# Patient Record
Sex: Female | Born: 1950 | Race: White | Hispanic: No | Marital: Married | Smoking: Never smoker
Health system: Southern US, Community
[De-identification: ages and names within clinical notes are randomized; demographics above are authoritative.]

## PROBLEM LIST (undated history)

## (undated) DIAGNOSIS — I2510 Atherosclerotic heart disease of native coronary artery without angina pectoris: Secondary | ICD-10-CM

## (undated) DIAGNOSIS — I10 Essential (primary) hypertension: Secondary | ICD-10-CM

## (undated) DIAGNOSIS — E785 Hyperlipidemia, unspecified: Secondary | ICD-10-CM

## (undated) HISTORY — DX: Hyperlipidemia, unspecified: E78.5

## (undated) HISTORY — DX: Essential (primary) hypertension: I10

## (undated) HISTORY — PX: TUMOR EXCISION: SHX421

## (undated) HISTORY — PX: TUBAL LIGATION: SHX77

## (undated) HISTORY — DX: Atherosclerotic heart disease of native coronary artery without angina pectoris: I25.10

---

## 2006-02-14 ENCOUNTER — Ambulatory Visit: Payer: Self-pay | Admitting: General Surgery

## 2009-06-19 ENCOUNTER — Ambulatory Visit: Payer: Self-pay | Admitting: Family Medicine

## 2009-07-02 ENCOUNTER — Inpatient Hospital Stay: Payer: Self-pay | Admitting: Internal Medicine

## 2009-07-03 ENCOUNTER — Encounter: Payer: Self-pay | Admitting: Cardiovascular Disease

## 2009-09-01 ENCOUNTER — Ambulatory Visit: Payer: Self-pay | Admitting: Family Medicine

## 2009-09-29 ENCOUNTER — Other Ambulatory Visit: Payer: Self-pay | Admitting: General Practice

## 2010-01-02 ENCOUNTER — Encounter: Payer: Self-pay | Admitting: Cardiovascular Disease

## 2010-01-02 ENCOUNTER — Other Ambulatory Visit: Payer: Self-pay | Admitting: Family Medicine

## 2010-01-02 LAB — CONVERTED CEMR LAB
AST: 14 units/L
Basophils Relative: 0 %
CO2: 31 meq/L
Calcium: 9.2 mg/dL
Chloride: 101 meq/L
Cholesterol: 134 mg/dL
Eosinophils Relative: 6 %
HCT: 39.3 %
Hemoglobin: 12.9 g/dL
Lymphocytes, automated: 24.9 %
Monocytes Relative: 5.5 %
Potassium: 4.3 meq/L
Sodium: 140 meq/L
WBC: 4.8 10*3/uL

## 2010-02-24 ENCOUNTER — Ambulatory Visit: Payer: Self-pay | Admitting: Cardiovascular Disease

## 2010-02-24 DIAGNOSIS — E119 Type 2 diabetes mellitus without complications: Secondary | ICD-10-CM

## 2010-02-24 DIAGNOSIS — I10 Essential (primary) hypertension: Secondary | ICD-10-CM | POA: Insufficient documentation

## 2010-02-24 DIAGNOSIS — I2510 Atherosclerotic heart disease of native coronary artery without angina pectoris: Secondary | ICD-10-CM | POA: Insufficient documentation

## 2010-02-24 DIAGNOSIS — E785 Hyperlipidemia, unspecified: Secondary | ICD-10-CM | POA: Insufficient documentation

## 2010-03-02 ENCOUNTER — Encounter: Payer: Self-pay | Admitting: Cardiovascular Disease

## 2010-04-08 ENCOUNTER — Other Ambulatory Visit: Payer: Self-pay | Admitting: Family Medicine

## 2010-09-15 ENCOUNTER — Encounter: Payer: Self-pay | Admitting: Cardiovascular Disease

## 2010-09-21 ENCOUNTER — Ambulatory Visit: Payer: Self-pay | Admitting: Cardiovascular Disease

## 2010-09-30 ENCOUNTER — Encounter: Payer: Self-pay | Admitting: Cardiovascular Disease

## 2010-12-17 ENCOUNTER — Ambulatory Visit: Payer: Self-pay | Admitting: Family Medicine

## 2011-01-12 NOTE — Miscellaneous (Signed)
  Clinical Lists Changes  Observations: Added new observation of ALBUMIN: 3.7 g/dL (86/57/8469 62:95) Added new observation of PROTEIN, TOT: 7.7 g/dL (28/41/3244 01:02) Added new observation of CALCIUM: 9.2 mg/dL (72/53/6644 03:47) Added new observation of ALK PHOS: 76 units/L (01/02/2010 17:00) Added new observation of BILI TOTAL: 0.5 mg/dL (42/59/5638 75:64) Added new observation of SGPT (ALT): 36 units/L (01/02/2010 17:00) Added new observation of SGOT (AST): 14 units/L (01/02/2010 17:00) Added new observation of CO2 PLSM/SER: 31 meq/L (01/02/2010 17:00) Added new observation of CL SERUM: 101 meq/L (01/02/2010 17:00) Added new observation of K SERUM: 4.3 meq/L (01/02/2010 17:00) Added new observation of NA: 140 meq/L (01/02/2010 17:00) Added new observation of CREATININE: 0.70 mg/dL (33/29/5188 41:66) Added new observation of BUN: 20 mg/dL (06/11/1600 09:32) Added new observation of BG RANDOM: 124 mg/dL (35/57/3220 25:42) Added new observation of TRIGLYCERIDE: 102 mg/dL (70/62/3762 83:15) Added new observation of HDL: 31 mg/dL (17/61/6073 71:06) Added new observation of LDL: 83 mg/dL (26/94/8546 27:03) Added new observation of CHOLESTEROL: 134 mg/dL (50/08/3817 29:93) Added new observation of BASOPHIL %: 0 % (01/02/2010 16:59) Added new observation of % EOS AUTO: 6.0 % (01/02/2010 16:59) Added new observation of MONOCYTE %: 5.5 % (01/02/2010 16:59) Added new observation of LYMPH %: 24.9 % (01/02/2010 16:59) Added new observation of PMN %: 63.2 % (01/02/2010 16:59) Added new observation of RDW: 14.2 % (01/02/2010 16:59) Added new observation of MCV: 79 fL (01/02/2010 16:59) Added new observation of PLATELETK/UL: 211 K/uL (01/02/2010 16:59) Added new observation of RBC M/UL: 5.00 M/uL (01/02/2010 16:59) Added new observation of HCT: 39.3 % (01/02/2010 16:59) Added new observation of HGB: 12.9 g/dL (71/69/6789 38:10) Added new observation of WBC COUNT: 4.8 10*3/microliter (01/02/2010  16:59)      -  Date:  01/02/2010    WBC: 4.8    HGB: 12.9    HCT: 39.3    RBC: 5.00    PLT: 211    MCV: 79    RDW: 14.2    Neutrophil: 63.2    Lymphs: 24.9    Monos: 5.5    Eos: 6.0    Basophil: 0    Cholesterol: 134    LDL: 83    HDL: 31    Triglycerides: 175    BG Random: 124    BUN: 20    Creatinine: 0.70    Sodium: 140    Potassium: 4.3    Chloride: 101    CO2 Total: 31    SGOT (AST): 14    SGPT (ALT): 36    T. Bilirubin: 0.5    Alk Phos: 76    Calcium: 9.2    Total Protein: 7.7    Albumin: 3.7

## 2011-01-12 NOTE — Miscellaneous (Signed)
Summary: Lisinopril  Clinical Lists Changes  Medications: Changed medication from LISINOPRIL 20 MG TABS (LISINOPRIL) Take one tablet by mouth daily to LISINOPRIL 20 MG TABS (LISINOPRIL) Take one tablet by mouth daily - Signed Rx of LISINOPRIL 20 MG TABS (LISINOPRIL) Take one tablet by mouth daily;  #30 x 6;  Signed;  Entered by: Benedict Needy, RN;  Authorized by: Dossie Arbour MD;  Method used: Electronically to [facility] [address], [place], [place], [place]  [address], Ph: [phone], Fax: [phone]    Prescriptions: LISINOPRIL 20 MG TABS (LISINOPRIL) Take one tablet by mouth daily  #30 x 6   Entered by:   Benedict Needy, RN   Authorized by:   Dossie Arbour MD   Signed by:   Benedict Needy, RN on 09/15/2010   Method used:   Electronically to        [facility] (retail)       [address]       [place], [place]  [address]       Ph: [phone]       Fax: [phone]   RxID:   [id number]

## 2011-01-12 NOTE — Cardiovascular Report (Signed)
Summary: Cardiac Cath Other  Cardiac Cath Other   Imported By: Harlon Flor 01/05/2010 10:39:11  _____________________________________________________________________  External Attachment:    Type:   Image     Comment:   External Document

## 2011-01-12 NOTE — Assessment & Plan Note (Signed)
Summary: NP6/AMD   Visit Type:  New Patient Referring Provider:  Kirke Corin Primary Provider:  Nilda Simmer, MD   History of Present Illness: very pleasant 60 year old woman with past medical history of coronary artery disease, 90% right coronary artery stenosis with drug eluding stent placed July 03, 2009 with mild disease in the other arteries, ejection fraction 55%, mild mitral regurgitation by echocardiogram in July 2010, poorly controlled diabetes, hyperlipidemia, obesity with a long history of secondhand smoke and hypertension who presents to establish care.  Jamie Barton states that overall she has been feeling well since her stent was placed in July 2010. Continues to work at lifestyles in an effort to improve her sugars. She states her last hemoglobin A1c was still greater than 8. She is uncertain what her cholesterol level is that she's been on a high-dose simvastatin 80 mg daily since July of last year. She is due to followup with Dr. Katrinka Blazing next month.  She is going to join a gym with her husband in the abdomen and is looking forward to working out. She is trying to lose weight in an effort to control her sugars. She states that her blood pressure has been well-controlled at home typically in the 120s to 130s systolic.  Catheterization report from July 03, 2009 shows 25% proximal LAD, 30% proximal circumflex, 25% ostial ramus disease, severe disease in the proximal RCA. PCI of ostial RCA was performed. Plavix recommended for at least one year.  Preventive Screening-Counseling & Management  Alcohol-Tobacco     Smoking Status: never  Caffeine-Diet-Exercise     Does Patient Exercise: yes      Drug Use:  no.    Current Problems (verified): 1)  Cad  (ICD-414.00) 2)  Dyslipidemia  (ICD-272.4) 3)  Hypertension, Benign  (ICD-401.1)  Current Medications (verified): 1)  Metformin Hcl 1000 Mg Tabs (Metformin Hcl) .Marland Kitchen.. 1 By Mouth Two Times A Day 2)  Lisinopril 20 Mg Tabs (Lisinopril) ....  Take One Tablet By Mouth Daily 3)  Plavix 75 Mg Tabs (Clopidogrel Bisulfate) .... Take One Tablet By Mouth Daily 4)  Glipizide 10 Mg Tabs (Glipizide) .Marland Kitchen.. 1 By Mouth Once Daily 5)  Simvastatin 80 Mg Tabs (Simvastatin) .... Take One Tablet By Mouth Daily At Bedtime 6)  Aspirin 81 Mg Tbec (Aspirin) .... Take One Tablet By Mouth Daily 7)  Januvia 50 Mg Tabs (Sitagliptin Phosphate) .Marland Kitchen.. 1 By Mouth Once Daily 8)  Nitrostat 0.4 Mg Subl (Nitroglycerin) .Marland Kitchen.. 1 Tablet Under Tongue At Onset of Chest Pain; You May Repeat Every 5 Minutes For Up To 3 Doses.  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  Alcohol Use - no Regular Exercise - yes Drug Use - no Smoking Status:  never Does Patient Exercise:  yes Drug Use:  no  Review of Systems  The patient denies fever, weight loss, weight gain, vision loss, decreased hearing, hoarseness, chest pain, syncope, dyspnea on exertion, peripheral edema, prolonged cough, abdominal pain, incontinence, muscle weakness, difficulty walking, depression, and enlarged lymph nodes.    Vital Signs:  Patient profile:   60 year old female Height:      65 inches Weight:  209.75 pounds BMI:     35.03 Pulse rate:   64 / minute Pulse rhythm:   regular BP sitting:   140 / 76  (left arm) Cuff size:   large  Vitals Entered By: Mercer Pod (February 24, 2010 11:29 AM)  Physical Exam  General:  well-appearing middle-aged woman in no apparent distress, H. EENT exam is benign, neck is supple with no JVP or carotid bruits, heart sounds regular  with S1-S2 no murmurs appreciated, lungs are clear to auscultation with no wheezes or rales, abdominal exam is benign, no significant lower extremity edema, pulses are equal and symmetrical in her upper and lower extremities, neurologic exam is grossly nonfocal, skin is warm and dry.    Impression & Recommendations:  Problem # 1:  CAD (ICD-414.00) stents placed to her ostial RCA in July 2010. No stress test indicated at this time G-tube no significant symptoms and due to recent intervention.   Her updated medication list for this problem includes:    Lisinopril 20 Mg Tabs (Lisinopril) .Marland Kitchen... Take one tablet by mouth daily    Plavix 75 Mg Tabs (Clopidogrel bisulfate) .Marland Kitchen... Take one tablet by mouth daily    Aspirin 81 Mg Tbec (Aspirin) .Marland Kitchen... Take one tablet by mouth daily    Nitrostat 0.4 Mg Subl (Nitroglycerin) .Marland Kitchen... 1 tablet under tongue at onset of chest pain; you may repeat every 5 minutes for up to 3 doses.  Problem # 2:  DYSLIPIDEMIA (ICD-272.4) We'll try to obtain the most recent cholesterol panel from Dr. Katrinka Blazing for our records. Ideally her goal LDL should be less than 70  Her updated medication list for this problem includes:    Simvastatin 80 Mg Tabs (Simvastatin) .Marland Kitchen... Take one tablet by mouth daily at bedtime  Problem # 3:  HYPERTENSION, BENIGN (ICD-401.1) Blood pressure is borderline elevated today though she states that typically at home her systolic range in the 120 area which would be excellent for her. We have not made any medication changes.  Her updated medication list for this problem includes:    Lisinopril 20 Mg Tabs (Lisinopril) .Marland Kitchen... Take one tablet by mouth daily    Aspirin 81 Mg Tbec (Aspirin) .Marland Kitchen... Take one tablet by mouth daily  Problem # 4:  DIAB W/O COMP TYPE II/UNS NOT STATED UNCNTRL (ICD-250.00) She is followed at [facility] for her diabetes. She states that she continues to cheat on occasion and over the holidays her sugars were in the 200s. Her hemoglobin  A1c is greater than 8. We have stressed to her the importance of good diabetes control in an effort to slow down the progression of her coronary artery disease and peripheral vascular disease. She'll continue to work hard on this. She may need additional medications and I will leave this up to lifestyles and Dr. Katrinka Blazing to manage.  Her updated medication list for this problem includes:    Metformin Hcl 1000 Mg Tabs (Metformin hcl) .Marland Kitchen... 1 by mouth two times a day    Lisinopril 20 Mg Tabs (Lisinopril) .Marland Kitchen... Take one tablet by mouth daily    Glipizide 10 Mg Tabs (Glipizide) .Marland Kitchen... 1 by mouth once daily    Aspirin 81 Mg Tbec (Aspirin) .Marland Kitchen... Take one tablet by mouth daily    Januvia 50 Mg Tabs (Sitagliptin phosphate) .Marland Kitchen... 1 by mouth once daily

## 2011-01-12 NOTE — Assessment & Plan Note (Signed)
Summary: F6M/AMD   Visit Type:  Follow-up Referring Provider:  Kirke Corin Primary Provider:  Nilda Simmer, MD  CC:  Denies chest pain or shortness of breath..  History of Present Illness: very pleasant 61 year old woman with past medical history of coronary artery disease, 90% right coronary artery stenosis with drug eluding stent placed July 03, 2009 with mild disease in the other arteries, ejection fraction 55%, mild mitral regurgitation by echocardiogram in July 2010, diabetes, hyperlipidemia, obesity with a long history of second hand smoke and hypertension who presents 4 routine followup.  Jamie Barton states that overall she is doing well. Her sugars have been better controlled though she did go to the beach recently and had weight gain and elevated glucose levels in the 200s. She has been nursing she got home. She is active, goes to the gym and has no complaints of chest pain or shortness of breath. Her blood pressure at home has been well controlled with systolics in the 120s to 130s. She does report a previous hemoglobin A1c in the 6-7 range.  Catheterization report from July 03, 2009 shows 25% proximal LAD, 30% proximal circumflex, 25% ostial ramus disease, severe disease in the proximal RCA. PCI of ostial RCA was performed. Plavix recommended for at least one year.  we do not have her most recent lipid panel and she had this checked recently at [facility].  EKG shows normal sinus rhythm with rate of 67 beats per minute, rare PVC, no significant ST or T wave changes  Current Medications (verified): 1)  Metformin Hcl 1000 Mg Tabs (Metformin Hcl) .Marland Kitchen.. 1 By Mouth Two Times A Day 2)  Lisinopril 20 Mg Tabs (Lisinopril) .... Take One Tablet By Mouth Daily 3)  Plavix 75 Mg Tabs (Clopidogrel Bisulfate) .... Take One Tablet By Mouth Daily 4)  Glipizide 10 Mg Tabs (Glipizide) .Marland Kitchen.. 1 By Mouth Once Daily 5)  Simvastatin 40 Mg Tabs (Simvastatin) .... One Tablet At Bedtime 6)  Aspirin 81 Mg Tbec (Aspirin)  .... Take One Tablet By Mouth Daily 7)  Januvia 50 Mg Tabs (Sitagliptin Phosphate) .Marland Kitchen.. 1 By Mouth Once Daily 8)  Nitrostat 0.4 Mg Subl (Nitroglycerin) .Marland Kitchen.. 1 Tablet Under Tongue At Onset of Chest Pain; You May Repeat Every 5 Minutes For Up To 3 Doses.  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  Alcohol Use - no Regular Exercise - yes Drug Use - no  Risk Factors: Exercise: yes (02/25/10)  Risk Factors: Smoking Status: never (02-25-2010)  Review of Systems  The patient denies fever, weight loss, weight gain, vision loss, decreased hearing, hoarseness, chest pain, syncope, dyspnea on exertion, peripheral edema, prolonged cough, abdominal pain, incontinence, muscle weakness, depression, and enlarged lymph nodes.    Vital Signs:  Patient profile:   60 year old female Height:      65 inches Weight:      212 pounds BMI:     35.41 Pulse rate:   61 / minute BP sitting:   154 / 85  (left arm) Cuff size:   large  Vitals Entered By: Bishop Dublin, CMA (  September 21, 2010 11:07 AM)  Physical Exam  General:  well-appearing middle-aged woman in no apparent distress, H. EENT exam is benign, neck is supple with no JVP or carotid bruits, heart sounds regular with S1-S2 no murmurs appreciated, lungs are clear to auscultation with no wheezes or rales, abdominal exam is benign, no significant lower extremity edema, pulses are equal and symmetrical in her upper and lower  extremities, neurologic exam is grossly nonfocal, skin is warm and dry.   Impression & Recommendations:  Problem # 1:  CAD (ICD-414.00) no symptoms of chest pain or shortness of breath concerning for angina. No testing indicated at this time.  Her updated medication list for this problem includes:    Lisinopril 20 Mg Tabs (Lisinopril) .Marland Kitchen... Take one tablet by mouth daily    Plavix 75 Mg Tabs (Clopidogrel bisulfate) .Marland Kitchen... Take one tablet by mouth daily    Aspirin 81 Mg Tbec (Aspirin) .Marland Kitchen... Take one tablet by mouth daily    Nitrostat 0.4 Mg Subl (Nitroglycerin) .Marland Kitchen... 1 tablet under tongue at onset of chest pain; you may repeat every 5 minutes for up to 3 doses.  Problem # 2:  DYSLIPIDEMIA (ICD-272.4) We will try to obtain her most recent lipid panel from [facility] this week. Goal LDL is less than 70.  Her updated medication list for this problem includes:    Simvastatin 40 Mg Tabs (Simvastatin) ..... One tablet at bedtime  Problem # 3:  HYPERTENSION, BENIGN (ICD-401.1) Blood pressure was elevated today in the clinic though she reported having a headache. She reports at home is well controlled.  Her updated medication list for this problem includes:    Lisinopril 20 Mg Tabs (Lisinopril) .Marland Kitchen... Take one tablet by mouth daily    Aspirin 81 Mg Tbec (Aspirin) .Marland Kitchen... Take one tablet by mouth daily  Problem # 4:  DIAB W/O COMP TYPE II/UNS NOT STATED UNCNTRL (ICD-250.00) She had good sugar control until recently when she went to the beach. We have asked her to watch her diet and weight in an effort to keep her diabetes well controlled.  Her updated medication list for this problem includes:    Metformin Hcl 1000 Mg Tabs (Metformin hcl) .Marland Kitchen... 1 by mouth two times a day    Lisinopril 20 Mg Tabs (Lisinopril) .Marland Kitchen... Take one tablet by mouth daily    Glipizide 10 Mg Tabs (Glipizide) .Marland Kitchen... 1 by mouth once daily    Aspirin 81 Mg Tbec (Aspirin) .Marland Kitchen... Take one tablet by mouth daily    Januvia 50 Mg Tabs  (Sitagliptin phosphate) .Marland Kitchen... 1 by mouth once daily

## 2011-01-12 NOTE — Progress Notes (Signed)
Summary: PHI  PHI   Imported By: Harlon Flor 02/25/2010 10:45:05  _____________________________________________________________________  External Attachment:    Type:   Image     Comment:   External Document

## 2011-01-12 NOTE — Letter (Signed)
Summary: [facility] - [facility]  Architectural technologist at [address]   [place], [place] [address]   Phone: [phone]  Fax: [phone]     September 30, 2010 MRN: [id number]   Jamie Barton [address] [place], [place]  [address]   Dear Ms. Chanda,  We have reviewed your cholesterol results.  They are as follows:     Total Cholesterol:    135 (Desirable: less than 150)       HDL  Cholesterol:     31  (Desirable: greater than 40 for men and 50 for women)       LDL Cholesterol:       83  (Desirable: less than 70 for moderate to high risk)       Triglycerides:         (Desirable: less than 150)  Our recommendations include: No changes at this time, continue to diet and excerise.    Call our office at the number listed above if you have any questions.  Lowering your LDL cholesterol is important, but it is only one of a large number of "risk factors" that may indicate that you are at risk for heart disease, stroke or other complications of hardening of the arteries.  Other risk factors include:   A.  Cigarette Smoking* B.  High Blood Pressure* C.  Obesity* D.   Low HDL Cholesterol (see yours above)* E.   Diabetes Mellitus (higher risk if your is uncontrolled) F.  Family history of premature heart disease G.  Previous history of stroke or cardiovascular disease    *These are risk factors YOU HAVE CONTROL OVER.  For more information, visit .  There is now evidence that lowering the TOTAL CHOLESTEROL AND LDL CHOLESTEROL can reduce the risk of heart disease.  The American Heart Association recommends the following guidelines for the treatment of elevated cholesterol:  1.  If there is now current heart disease and less than two risk factors, TOTAL CHOLESTEROL should be less than 200 and LDL CHOLESTEROL should be less than 100. 2.  If there is current heart disease or two or more risk factors, TOTAL CHOLESTEROL should be less than 200 and LDL CHOLESTEROL should  be less than 70.  A diet low in cholesterol, saturated fat, and calories is the cornerstone of treatment for elevated cholesterol.  Cessation of smoking and exercise are also important in the management of elevated cholesterol and preventing vascular disease.  Studies have shown that 30 to 60 minutes of physical activity most days can help lower blood pressure, lower cholesterol, and keep your weight at a healthy level.  Drug therapy is used when cholesterol levels do not respond to therapeutic lifestyle changes (smoking cessation, diet, and exercise) and remains unacceptably high.  If medication is started, it is important to have you levels checked periodically to evaluate the need for further treatment options.  Thank you,    [employer] Team

## 2011-02-16 ENCOUNTER — Other Ambulatory Visit: Payer: Self-pay | Admitting: Family Medicine

## 2011-02-19 ENCOUNTER — Ambulatory Visit: Payer: Self-pay | Admitting: Family Medicine

## 2011-03-22 ENCOUNTER — Other Ambulatory Visit: Payer: Self-pay | Admitting: Emergency Medicine

## 2011-03-22 MED ORDER — LISINOPRIL 20 MG PO TABS: 20.0000 mg | ORAL_TABLET | Freq: Every day | ORAL | Status: AC

## 2011-03-25 ENCOUNTER — Ambulatory Visit: Payer: Self-pay | Admitting: Urology

## 2011-06-23 ENCOUNTER — Encounter: Payer: Self-pay | Admitting: Cardiovascular Disease

## 2011-07-10 ENCOUNTER — Observation Stay: Payer: Self-pay | Admitting: Internal Medicine

## 2011-07-10 DIAGNOSIS — R079 Chest pain, unspecified: Secondary | ICD-10-CM

## 2011-07-13 ENCOUNTER — Encounter: Payer: Self-pay | Admitting: *Deleted

## 2011-07-13 ENCOUNTER — Ambulatory Visit (INDEPENDENT_AMBULATORY_CARE_PROVIDER_SITE_OTHER): Payer: No Typology Code available for payment source | Admitting: Cardiovascular Disease

## 2011-07-13 ENCOUNTER — Encounter: Payer: Self-pay | Admitting: Cardiovascular Disease

## 2011-07-13 DIAGNOSIS — R079 Chest pain, unspecified: Secondary | ICD-10-CM

## 2011-07-13 DIAGNOSIS — E119 Type 2 diabetes mellitus without complications: Secondary | ICD-10-CM

## 2011-07-13 DIAGNOSIS — E785 Hyperlipidemia, unspecified: Secondary | ICD-10-CM

## 2011-07-13 DIAGNOSIS — I2510 Atherosclerotic heart disease of native coronary artery without angina pectoris: Secondary | ICD-10-CM

## 2011-07-13 DIAGNOSIS — I10 Essential (primary) hypertension: Secondary | ICD-10-CM

## 2011-07-13 NOTE — Assessment & Plan Note (Signed)
Cholesterol is at goal on the current lipid regimen. No changes to the medications were made.  

## 2011-07-13 NOTE — Patient Instructions (Signed)
You are doing well. No medication changes were made. Please call us if you have new issues that need to be addressed before your next appt.  We will call you for a follow up Appt. In 12 months  

## 2011-07-13 NOTE — Progress Notes (Signed)
Patient ID: Jamie Barton, female    DOB: [birth year], 60 y.o.   MRN: [id number]  HPI Comments: pleasant 60 year old woman with past medical history of coronary artery disease, 90% right coronary artery stenosis with drug eluding stent placed July 03, 2009 with mild disease in the other arteries, ejection fraction 55%, mild mitral regurgitation by echocardiogram in July 2010, diabetes, hyperlipidemia, obesity with a long history of second hand smoke and hypertension who presents After recent evaluation at [facility]. She was seen by cardiology for chest pain.  Chest pain was felt to be noncardiac. She was started on nitroglycerin paste which resulted in significant nausea or vomiting. She continues to have tight muscles in her abdomen from the vomiting. She was started on metoprolol for severe hypertension on arrival. Systolic pressures were in the 200s. Her blood pressure has been significantly improved at home. She has had no further episodes of chest pain.    Hemoglobin A1c in the hospital was 5.7, LDL 62, total cholesterol 782   Catheterization report from July 03, 2009 shows 25% proximal LAD, 30% proximal circumflex, 25% ostial ramus disease, severe disease in the proximal RCA. PCI of ostial RCA was performed. Plavix recommended for at least one year.   EKG shows normal sinus rhythm with rate of 65 beats per minute, no significant ST or T wave changes, Left axis deviation      Outpatient Encounter Prescriptions as of 07/13/2011  Medication Sig Dispense Refill  . aspirin 81 MG tablet Take 81 mg by mouth daily.        . Biotin 5 MG TABS Take by mouth.        . clopidogrel (PLAVIX) 75 MG tablet Take 75 mg by mouth daily.        Marland Kitchen glipiZIDE (GLUCOTROL) 10 MG tablet Take 10 mg by mouth daily.        Marland Kitchen lisinopril (PRINIVIL,ZESTRIL) 20 MG tablet Take 1 tablet (20 mg total) by mouth daily.  90 tablet  3  . metFORMIN (GLUCOPHAGE) 1000 MG tablet Take 1,000 mg by mouth 2 (two) times daily.        .  metoprolol succinate (TOPROL-XL) 25 MG 24 hr tablet Take 25 mg by mouth daily.        . nitroGLYCERIN (NITROSTAT) 0.4 MG SL tablet Place 0.4 mg under the tongue every 5 (five) minutes as needed. May repeat for up to 3 doses.       . simvastatin (ZOCOR) 40 MG tablet Take 40 mg by mouth at bedtime.        . sitaGLIPtin (JANUVIA) 50 MG tablet Take 100 mg by mouth daily.          Review of Systems  Constitutional: Negative.   HENT: Negative.   Eyes: Negative.   Respiratory: Negative.   Cardiovascular: Negative.   Gastrointestinal: Negative.   Musculoskeletal: Negative.   Skin: Negative.   Neurological: Negative.   Hematological: Negative.   Psychiatric/Behavioral: Negative.   All other systems reviewed and are negative.    BP 135/80  Pulse 64  Ht 5\' 5"  (1.651 m)  Wt 210 lb (95.255 kg)  BMI 34.95 kg/m2  Physical Exam  Nursing note and vitals reviewed. Constitutional: She is oriented to person, place, and time. She appears well-developed and well-nourished.       obese  HENT:  Head: Normocephalic.  Nose: Nose normal.  Mouth/Throat: Oropharynx is clear and moist.  Eyes: Conjunctivae are normal. Pupils are equal, round, and reactive to light.  Neck: Normal range of motion. Neck supple. No JVD present.  Cardiovascular: Normal rate, regular rhythm, S1 normal, S2 normal, normal heart sounds and intact distal pulses.  Exam reveals no gallop and no friction rub.   No murmur heard. Pulmonary/Chest: Effort normal and breath sounds normal. No respiratory distress. She has no wheezes. She has no rales. She exhibits no tenderness.  Abdominal: Soft. Bowel sounds are normal. She exhibits no distension. There is no tenderness.  Musculoskeletal: Normal range of motion. She exhibits no edema and no tenderness.  Lymphadenopathy:    She has no cervical adenopathy.  Neurological: She is alert and oriented to person, place, and time. Coordination normal.  Skin: Skin is warm and dry. No rash  noted. No erythema.  Psychiatric: She has a normal mood and affect. Her behavior is normal. Judgment and thought content normal.         Assessment and Plan

## 2011-07-13 NOTE — Assessment & Plan Note (Signed)
She has had no further episodes of chest pain. Previous episode was felt to be atypical. I asked her to contact our office if she has any further episodes and we would arrange a stress test. Recent workup was negative with normal cardiac enzymes and EKG.

## 2011-07-13 NOTE — Assessment & Plan Note (Signed)
We have encouraged continued exercise, careful diet management in an effort to lose weight. 

## 2011-07-13 NOTE — Assessment & Plan Note (Signed)
Blood pressure is well controlled on today's visit. No changes made to the medications. 

## 2011-07-19 IMAGING — US TRANSABDOMINAL ULTRASOUND OF PELVIS
1 series · 17 of 25 positions shown · non-contrast
Comparison: none

REASON FOR EXAM: pelvic pain
COMMENTS:

[Series 1: transabdominal ultrasound of pelvis · 17 of 87 slices shown]
[im 1/87]
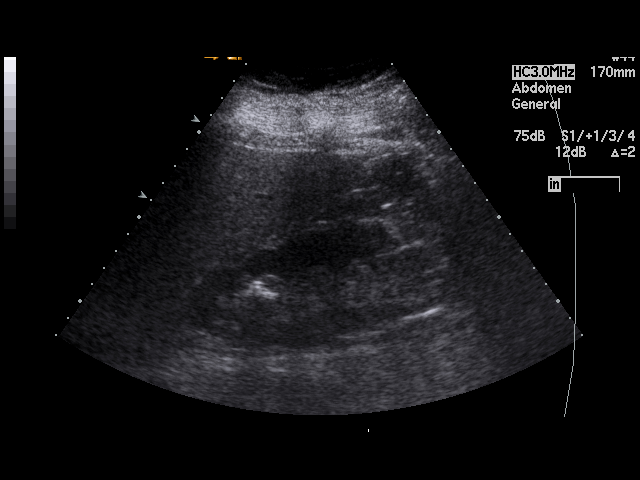
[im 8/87]
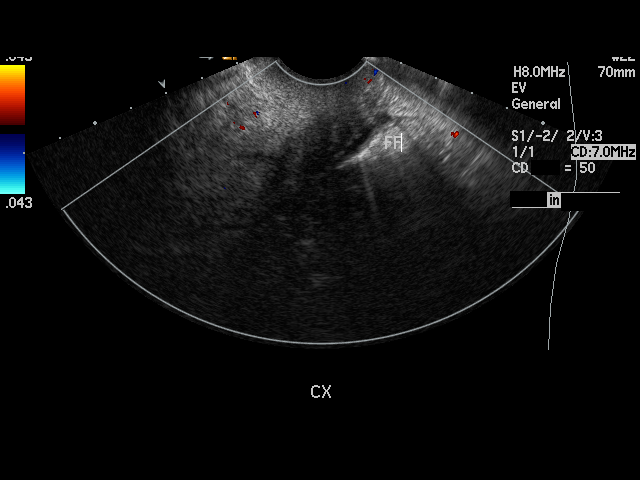
[im 11/87]
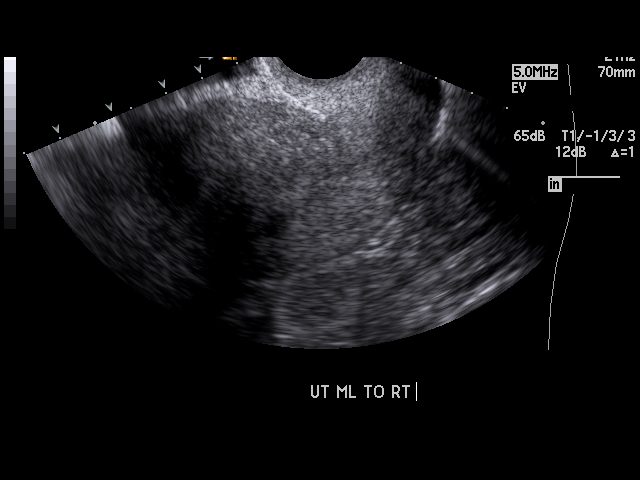
[im 18/87]
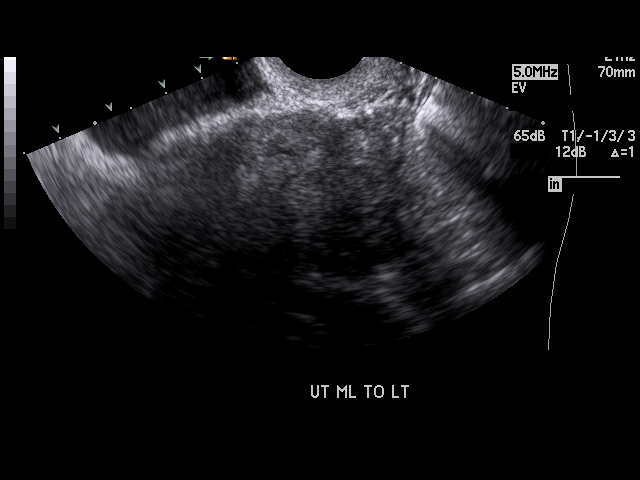
[im 22/87]
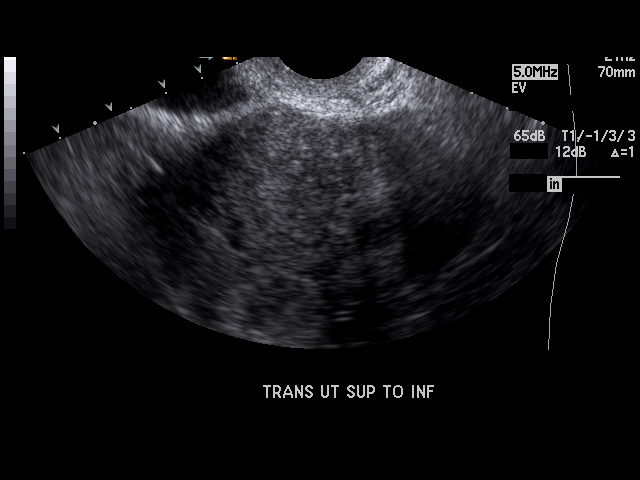
[im 29/87]
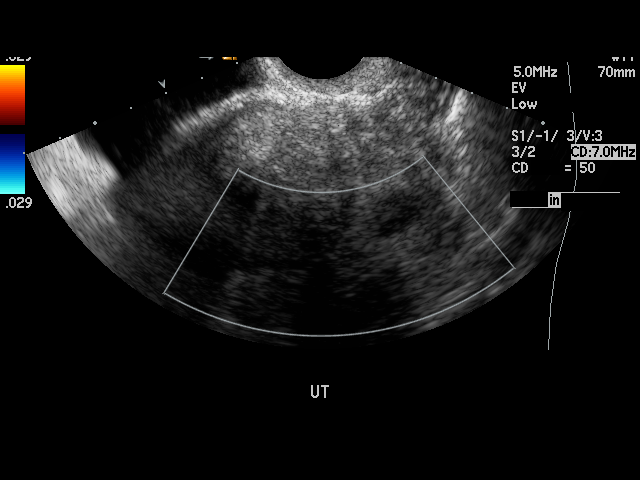
[im 33/87]
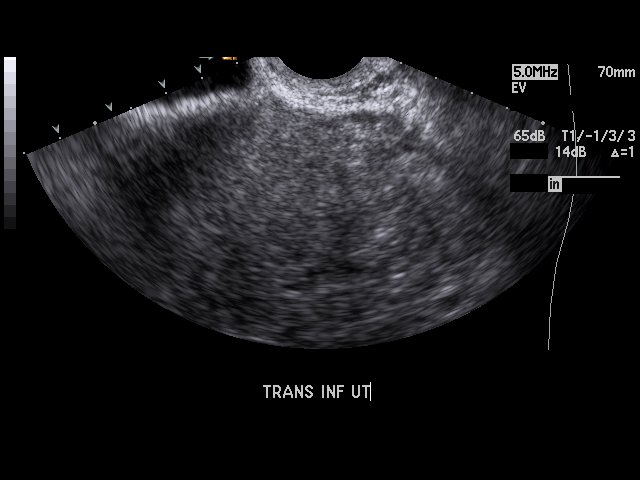
[im 40/87]
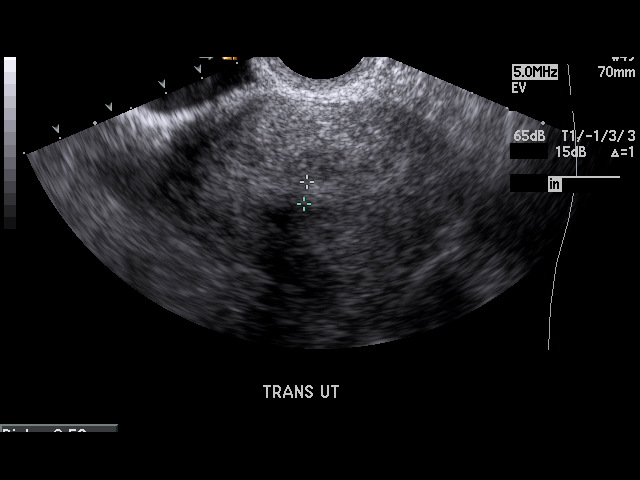
[im 44/87]
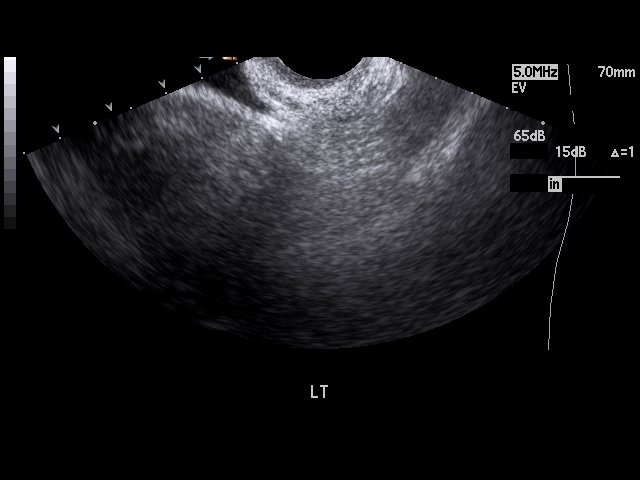
[im 47/87]
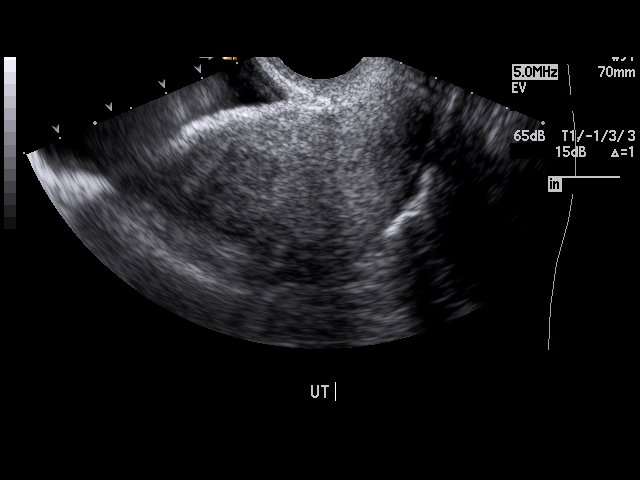
[im 54/87]
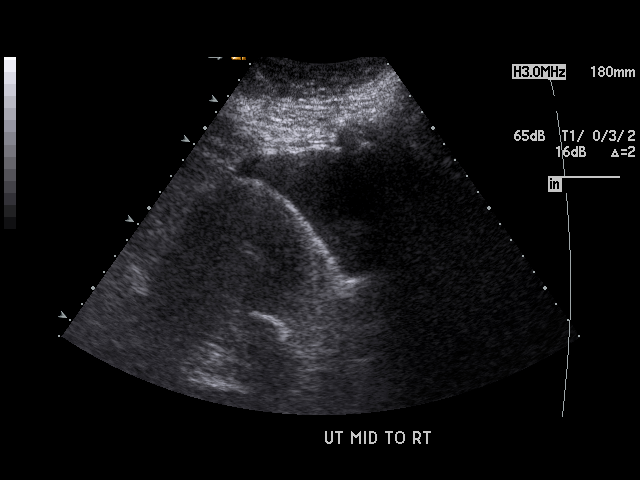
[im 58/87]
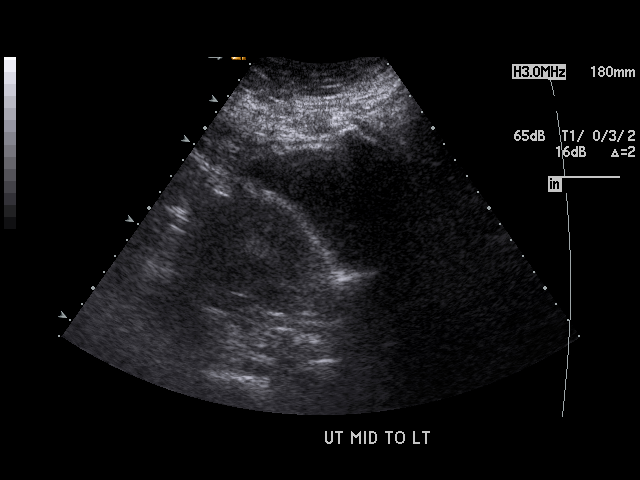
[im 65/87]
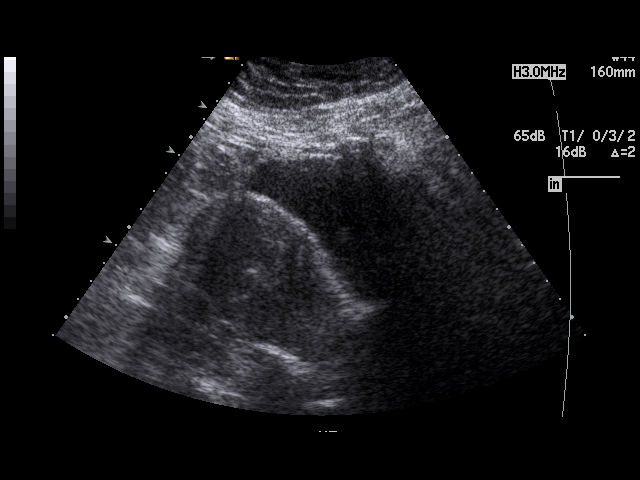
[im 69/87]
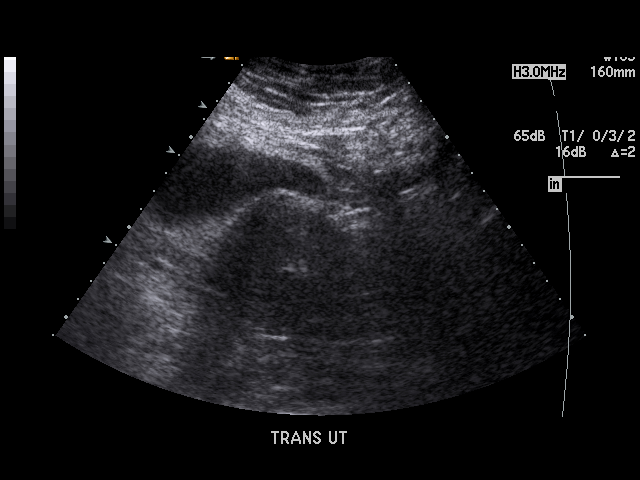
[im 76/87]
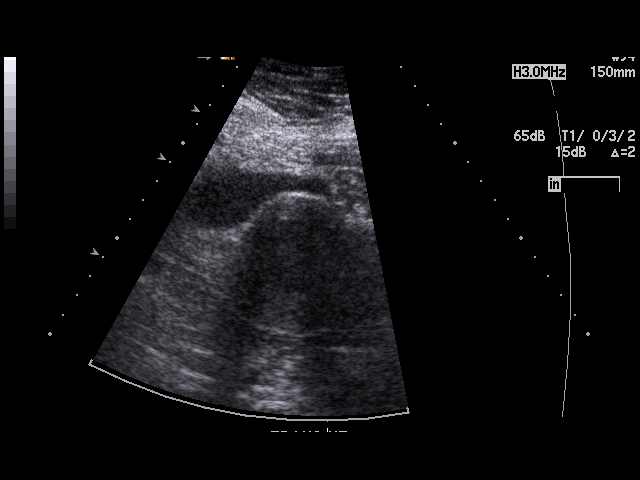
[im 79/87]
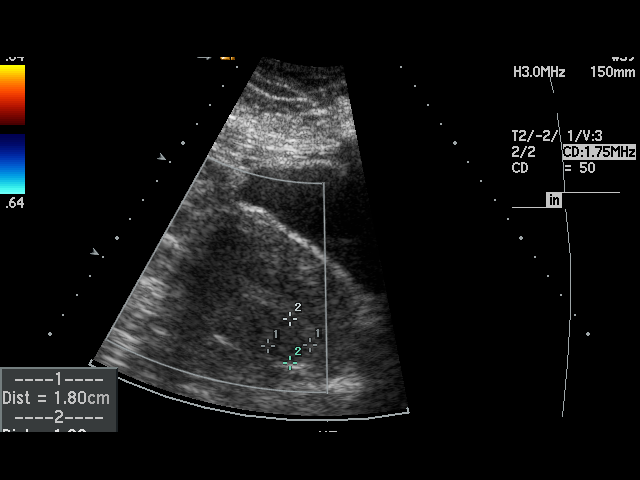
[im 87/87]
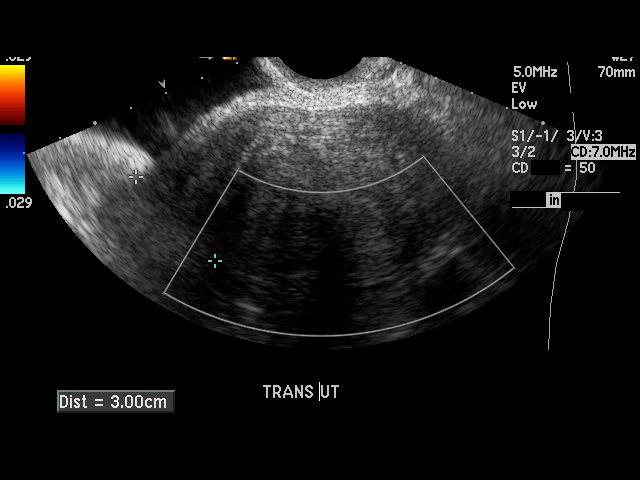

[17 of 25 positions shown; findings below may reference images not displayed]

PROCEDURE:     SAREEN - SAREEN PELVIS NON-OB W/TRANSVAGINAL  - February 19, 2011 [DATE]

RESULT:     Pelvic sonogram demonstrates moderate hydronephrosis in the left
kidney. There is an upper pole large right renal calculus. The uterus
demonstrates complex areas of echotexture posteriorly suggestive of at least
three fibroids with the largest measuring up to approximately 3.9 cm. The
endometrium measures 7.7 mm. The ovaries are not seen. A trace of free fluid
is present in the cul-de-sac.
IMPRESSION: 1. Fibroid uterus.
2. Left hydronephrosis.
3. Upper pole right renal stone.

## 2011-08-03 ENCOUNTER — Telehealth: Payer: Self-pay

## 2011-08-03 MED ORDER — METOPROLOL SUCCINATE ER 25 MG PO TB24: 25.0000 mg | ORAL_TABLET | Freq: Every day | ORAL | Status: AC

## 2011-08-03 NOTE — Telephone Encounter (Signed)
Refill sent for metoprolol 25 mg one tablet daily.

## 2011-08-22 IMAGING — CR DG ABDOMEN 1V
1 series · 4 of 4 positions shown · non-contrast
Comparison: none

REASON FOR EXAM: Calculus
COMMENTS:

[Series 1: view not recorded · 0.17mm/px · 4 of 4 slices shown]
[im 1/4]
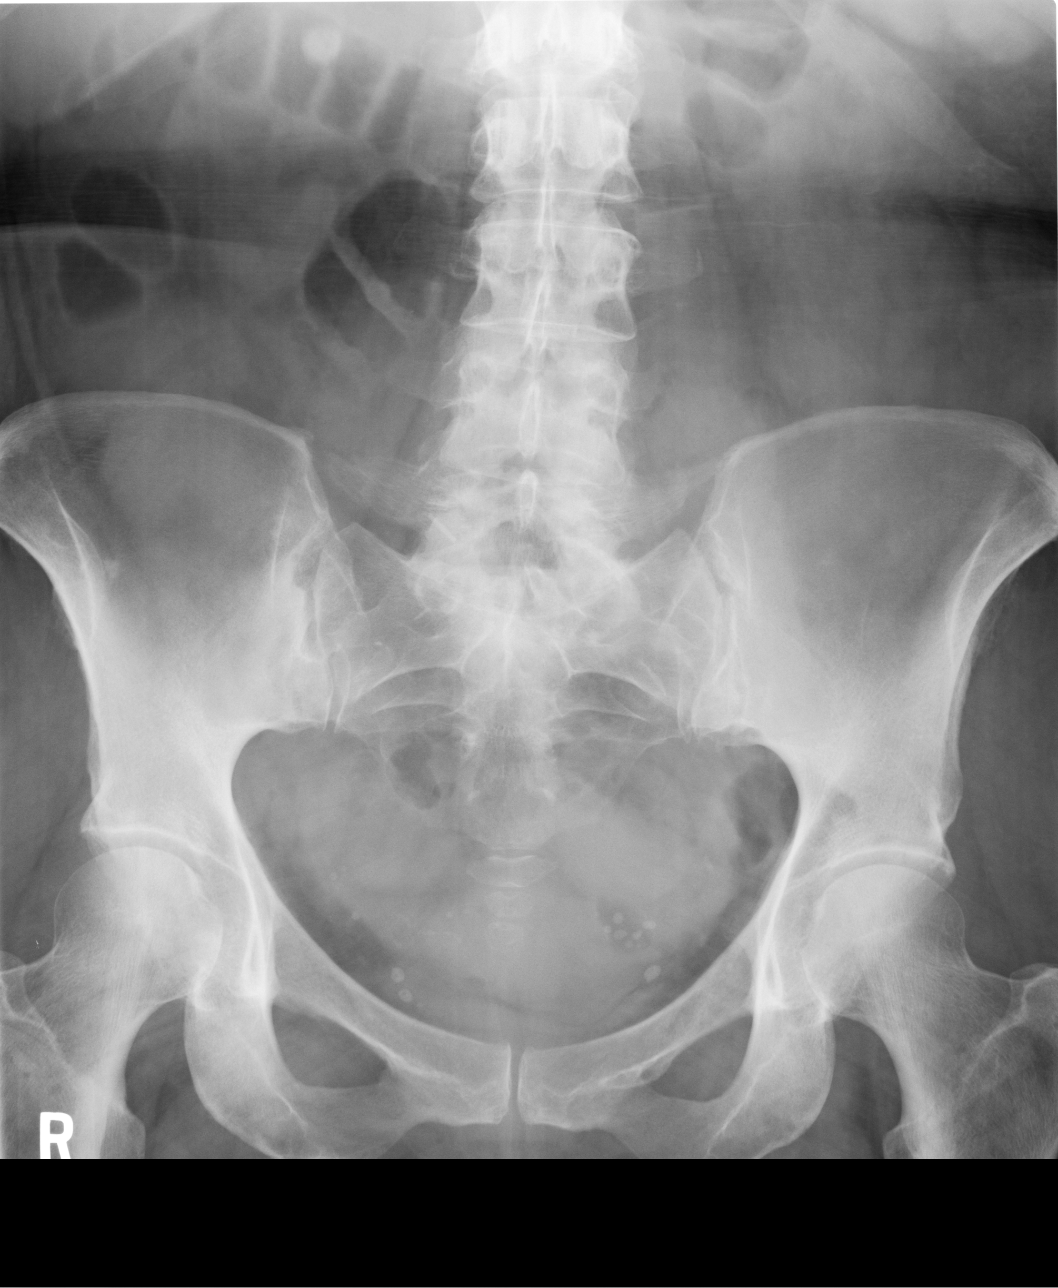
[im 2/4]
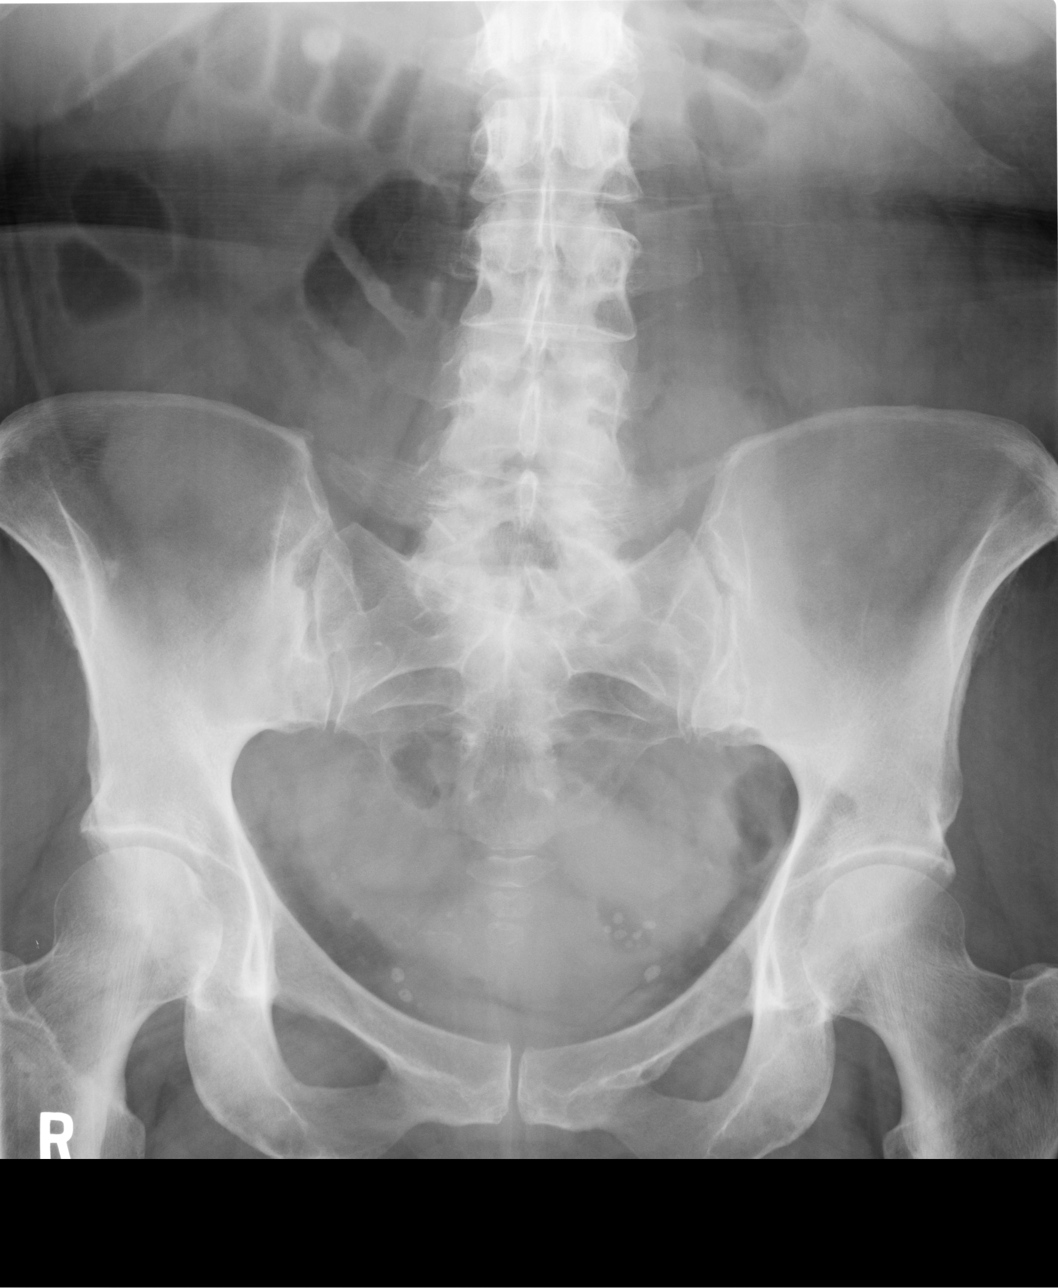
[im 3/4]
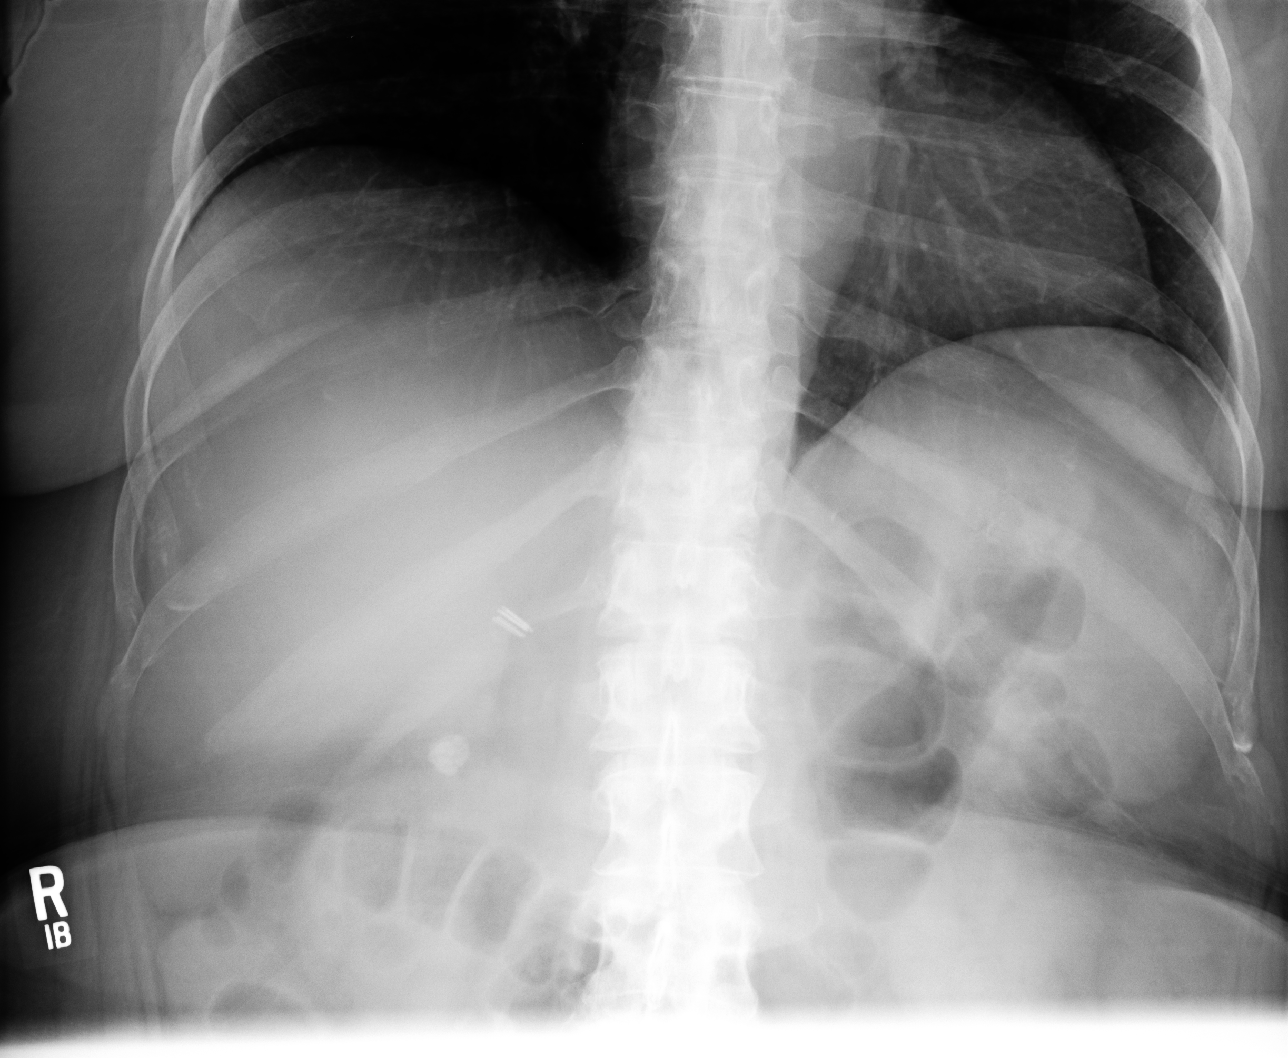
[im 4/4]
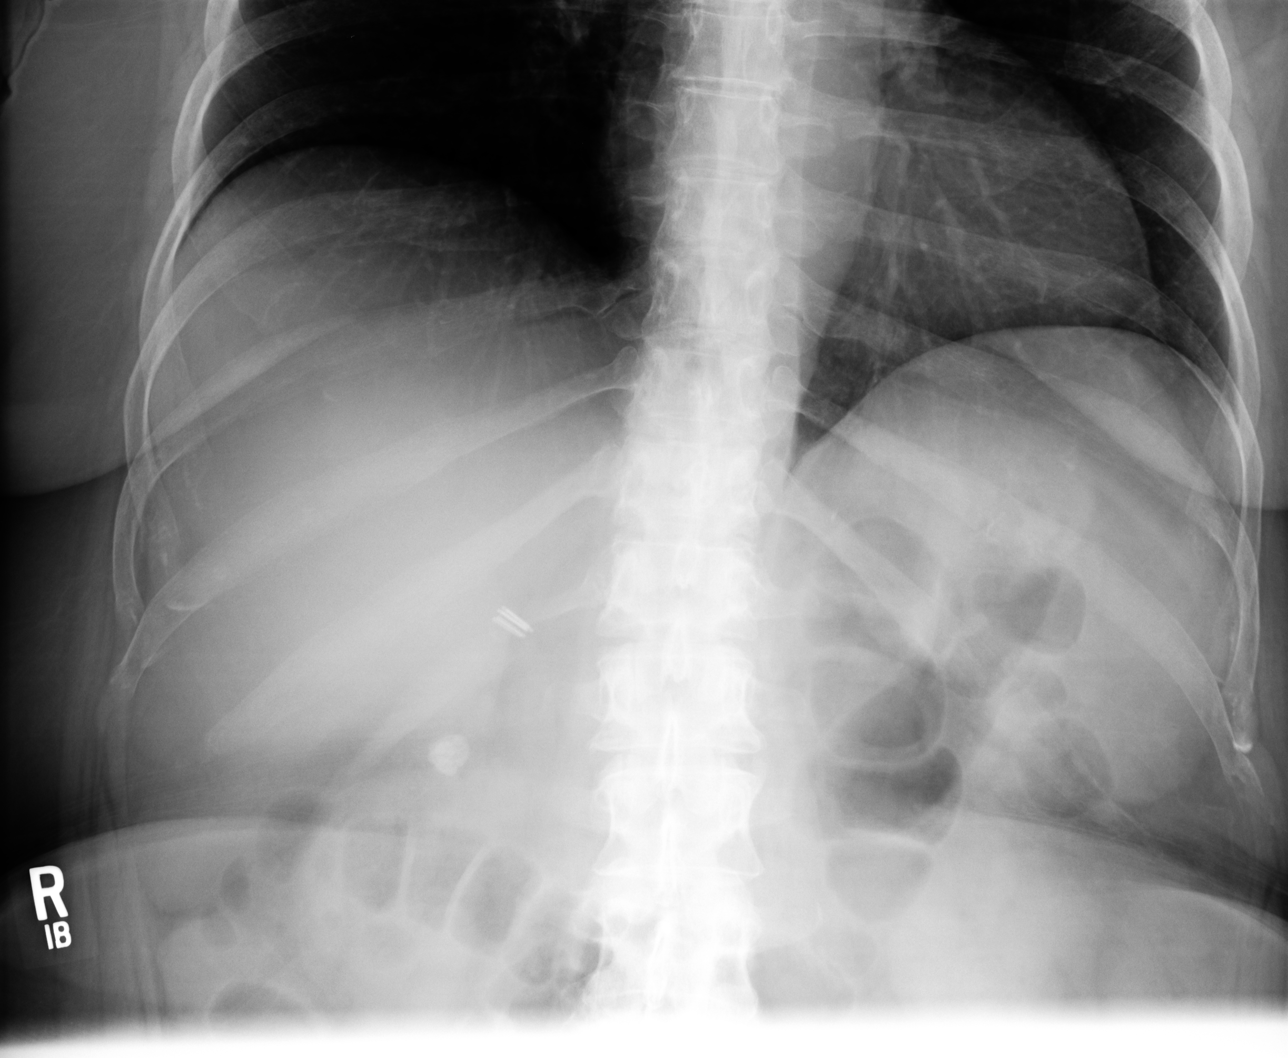

[4 of 4 positions shown; findings below may reference images not displayed]

PROCEDURE:     DXR - DXR KIDNEY URETER BLADDER  - March 25, 2011  [DATE]

RESULT:     Cholecystectomy clips are present. There is a rounded calcific
density in the region of the right kidney suggestive of a large right renal
stone. Phleboliths are seen in the pelvic region bilaterally. The bowel gas
pattern appears unremarkable.
IMPRESSION: Please see above.

## 2011-12-23 ENCOUNTER — Ambulatory Visit: Payer: Self-pay

## 2012-01-17 ENCOUNTER — Telehealth: Payer: Self-pay | Admitting: Cardiovascular Disease

## 2012-01-17 NOTE — Telephone Encounter (Signed)
All Cardiac faxed to [facility] @ [phone]/[email or web address] Arlana Pouch 01/17/12/KM

## 2012-12-26 ENCOUNTER — Ambulatory Visit: Payer: Self-pay

## 2013-12-27 ENCOUNTER — Ambulatory Visit: Payer: Self-pay

## 2014-12-31 ENCOUNTER — Ambulatory Visit: Payer: Self-pay

## 2015-05-28 ENCOUNTER — Telehealth: Payer: Self-pay

## 2015-05-28 NOTE — Patient Outreach (Signed)
[facility]) Care Management  05/28/2015  Chesna Staples 26-May-1951 [id number]   I phoned [place] to schedule her next appointment- she has notified me that she is no longer working for [employer], therefore she and her husband Jhavia Revoir are ineligible for benefits through the plan.  I will discharge her from the [facility] program.  Susette Racer, RN, [place], [place], CDE Diabetes Coordinator Inpatient Diabetes Program  [phone] (Team Pager) [phone] [facility] Office) 05/28/2015 9:19 AM

## 2016-01-23 ENCOUNTER — Encounter: Payer: Self-pay | Admitting: *Deleted

## 2016-01-23 ENCOUNTER — Emergency Department
Admission: EM | Admit: 2016-01-23 | Discharge: 2016-01-23 | Disposition: A | Discharge status: Home / Self Care | Payer: BLUE CROSS/BLUE SHIELD | Attending: Emergency Medicine | Admitting: Emergency Medicine

## 2016-01-23 DIAGNOSIS — I10 Essential (primary) hypertension: Secondary | ICD-10-CM | POA: Diagnosis not present

## 2016-01-23 DIAGNOSIS — Y9289 Other specified places as the place of occurrence of the external cause: Secondary | ICD-10-CM | POA: Diagnosis not present

## 2016-01-23 DIAGNOSIS — Z7902 Long term (current) use of antithrombotics/antiplatelets: Secondary | ICD-10-CM | POA: Insufficient documentation

## 2016-01-23 DIAGNOSIS — S1086XA Insect bite of other specified part of neck, initial encounter: Secondary | ICD-10-CM | POA: Insufficient documentation

## 2016-01-23 DIAGNOSIS — E785 Hyperlipidemia, unspecified: Secondary | ICD-10-CM | POA: Diagnosis not present

## 2016-01-23 DIAGNOSIS — Y998 Other external cause status: Secondary | ICD-10-CM | POA: Diagnosis not present

## 2016-01-23 DIAGNOSIS — Z7984 Long term (current) use of oral hypoglycemic drugs: Secondary | ICD-10-CM | POA: Diagnosis not present

## 2016-01-23 DIAGNOSIS — Y9389 Activity, other specified: Secondary | ICD-10-CM | POA: Diagnosis not present

## 2016-01-23 DIAGNOSIS — Z7982 Long term (current) use of aspirin: Secondary | ICD-10-CM | POA: Diagnosis not present

## 2016-01-23 DIAGNOSIS — Z792 Long term (current) use of antibiotics: Secondary | ICD-10-CM | POA: Diagnosis not present

## 2016-01-23 DIAGNOSIS — E119 Type 2 diabetes mellitus without complications: Secondary | ICD-10-CM | POA: Insufficient documentation

## 2016-01-23 DIAGNOSIS — Z79899 Other long term (current) drug therapy: Secondary | ICD-10-CM | POA: Insufficient documentation

## 2016-01-23 DIAGNOSIS — W57XXXA Bitten or stung by nonvenomous insect and other nonvenomous arthropods, initial encounter: Secondary | ICD-10-CM | POA: Diagnosis not present

## 2016-01-23 MED ORDER — TRAMADOL HCL 50 MG PO TABS
50.0000 mg | ORAL_TABLET | Freq: Once | ORAL | Status: AC
  Administered 2016-01-23: 50 mg via ORAL
  Filled 2016-01-23: qty 1

## 2016-01-23 MED ORDER — TRAMADOL HCL 50 MG PO TABS: 50.0000 mg | ORAL_TABLET | Freq: Four times a day (QID) | ORAL | Status: AC | PRN

## 2016-01-23 MED ORDER — DOXYCYCLINE MONOHYDRATE 100 MG PO CAPS: 100.0000 mg | ORAL_CAPSULE | Freq: Two times a day (BID) | ORAL | Status: DC

## 2016-01-23 MED ORDER — DOXYCYCLINE HYCLATE 100 MG PO TABS
100.0000 mg | ORAL_TABLET | Freq: Once | ORAL | Status: AC
  Administered 2016-01-23: 100 mg via ORAL
  Filled 2016-01-23: qty 1

## 2016-01-23 NOTE — Discharge Instructions (Signed)
Tick Bite Information °Ticks are insects that attach themselves to the skin. There are many types of ticks. Common types include wood ticks and deer ticks. Sometimes, ticks carry diseases that can make a person very ill. The most common places for ticks to attach themselves are the scalp, neck, armpits, waist, and groin.  °HOW CAN YOU PREVENT TICK BITES? °Take these steps to help prevent tick bites when you are outdoors: °· Wear long sleeves and long pants. °· Wear white clothes so you can see ticks more easily. °· Tuck your pant legs into your socks. °· If walking on a trail, stay in the middle of the trail to avoid brushing against bushes. °· Avoid walking through areas with long grass. °· Put bug spray on all skin that is showing and along boot tops, pant legs, and sleeve cuffs. °· Check clothes, hair, and skin often and before going inside. °· Brush off any ticks that are not attached. °· Take a shower or bath as soon as possible after being outdoors. °HOW SHOULD YOU REMOVE A TICK? °Ticks should be removed as soon as possible to help prevent diseases. °1. If latex gloves are available, put them on before trying to remove a tick. °2. Use tweezers to grasp the tick as close to the skin as possible. You may also use curved forceps or a tick removal tool. Grasp the tick as close to its head as possible. Avoid grasping the tick on its body. °3. Pull gently upward until the tick lets go. Do not twist the tick or jerk it suddenly. This may break off the tick's head or mouth parts. °4. Do not squeeze or crush the tick's body. This could force disease-carrying fluids from the tick into your body. °5. After the tick is removed, wash the bite area and your hands with soap and water or alcohol. °6. Apply a small amount of antiseptic cream or ointment to the bite site. °7. Wash any tools that were used. °Do not try to remove a tick by applying a hot match, petroleum jelly, or fingernail polish to the tick. These methods do  not work. They may also increase the chances of disease being spread from the tick bite. °WHEN SHOULD YOU SEEK HELP? °Contact your health care provider if you are unable to remove a tick or if a part of the tick breaks off in the skin. °After a tick bite, you need to watch for signs and symptoms of diseases that can be spread by ticks. Contact your health care provider if you develop any of the following: °· Fever. °· Rash. °· Redness and puffiness (swelling) in the area of the tick bite. °· Tender, puffy lymph glands. °· Watery poop (diarrhea). °· Weight loss. °· Cough. °· Feeling more tired than normal (fatigue). °· Muscle, joint, or bone pain. °· Belly (abdominal) pain. °· Headache. °· Change in your level of consciousness. °· Trouble walking or moving your legs. °· Loss of feeling (numbness) in the legs. °· Loss of movement (paralysis). °· Shortness of breath. °· Confusion. °· Throwing up (vomiting) many times. °  °This information is not intended to replace advice given to you by your health care provider. Make sure you discuss any questions you have with your health care provider. °  °Document Released: 02/23/2010 Document Revised: 08/01/2013 Document Reviewed: 05/09/2013 °Elsevier Interactive Patient Education ©2016 Elsevier Inc. ° °

## 2016-01-23 NOTE — ED Notes (Signed)
Pt presents w/ c/o tick head embedded into the back of her neck. Pt states she scratched the tick off. Pt has a small red induration w/ black center. Pt states her daughter saw that it had legs attached to what was left.

## 2016-01-23 NOTE — ED Notes (Signed)
Pt c/o tick on left side of neck. Denies fever

## 2016-01-23 NOTE — ED Provider Notes (Signed)
[facility] Emergency Department Provider Note  ____________________________________________  Time seen: Approximately 11:33 PM  I have reviewed the triage vital signs and the nursing notes.   HISTORY  Chief Complaint Tick Removal    HPI Jamie Barton is a 65 y.o. female patient were tick bite to left side of neck. Patient with less than 24 hours. Denies any fever or chills or myalgia. Tick was partially removed by daughter. She rates her pain discomfort as 8/10. No other palliative measures taken prior to arrival.   Past Medical History  Diagnosis Date  . Severe uncontrolled hypertension   . Diabetes mellitus   . Hyperlipidemia   . CAD (coronary artery disease)     Patient Active Problem List   Diagnosis Date Noted  . DIAB W/O COMP TYPE II/UNS NOT STATED UNCNTRL 02/24/2010  . DYSLIPIDEMIA 02/24/2010  . HYPERTENSION, BENIGN 02/24/2010  . CAD 02/24/2010    Past Surgical History  Procedure Laterality Date  . Tubal ligation    . Tumor excision      Left ring finger    Current Outpatient Rx  Name  Route  Sig  Dispense  Refill  . Liraglutide (VICTOZA [place])   Subcutaneous   Inject 1.8 mLs into the skin.         Marland Kitchen aspirin 81 MG tablet   Oral   Take 81 mg by mouth daily.           . Biotin 5 MG TABS   Oral   Take by mouth.           . clopidogrel (PLAVIX) 75 MG tablet   Oral   Take 75 mg by mouth daily.           Marland Kitchen doxycycline (MONODOX) 100 MG capsule   Oral   Take 1 capsule (100 mg total) by mouth 2 (two) times daily.   20 capsule   0   . glipiZIDE (GLUCOTROL) 10 MG tablet   Oral   Take 10 mg by mouth daily.           Marland Kitchen lisinopril (PRINIVIL,ZESTRIL) 20 MG tablet   Oral   Take 1 tablet (20 mg total) by mouth daily.   90 tablet   3   . metFORMIN (GLUCOPHAGE) 1000 MG tablet   Oral   Take 1,000 mg by mouth 2 (two) times daily.           . metoprolol succinate (TOPROL-XL) 25 MG 24 hr tablet   Oral   Take  1 tablet (25 mg total) by mouth daily.   30 tablet   6   . nitroGLYCERIN (NITROSTAT) 0.4 MG SL tablet   Sublingual   Place 0.4 mg under the tongue every 5 (five) minutes as needed. May repeat for up to 3 doses.          . simvastatin (ZOCOR) 40 MG tablet   Oral   Take 40 mg by mouth at bedtime.           . sitaGLIPtin (JANUVIA) 50 MG tablet   Oral   Take 100 mg by mouth daily.            Allergies Codeine  Family History  Problem Relation Age of Onset  . Pulmonary fibrosis Mother   . Colon cancer Sister   . Lung cancer Sister   . Aneurysm Brother     AAA    Social History Social History  Substance Use Topics  . Smoking  status: Never Smoker   . Smokeless tobacco: None  . Alcohol Use: No    Review of Systems Constitutional: No fever/chills Eyes: No visual changes. ENT: No sore throat. Cardiovascular: Denies chest pain. Respiratory: Denies shortness of breath. Gastrointestinal: No abdominal pain.  No nausea, no vomiting.  No diarrhea.  No constipation. Genitourinary: Negative for dysuria. Musculoskeletal: Negative for back pain. Skin: Negative for rash. Neurological: Negative for headaches, focal weakness or numbness. Endocrine:Hypertension, lipidemia, and diabetes Hematological/Lymphatic: Allergic/Immunilogical: Rating  10-point ROS otherwise negative.  ____________________________________________   PHYSICAL EXAM:  VITAL SIGNS: ED Triage Vitals  Enc Vitals Group     BP 01/23/16 2203 185/79 mmHg     Pulse Rate 01/23/16 2203 65     Resp 01/23/16 2203 20     Temp 01/23/16 2203 97.9 F (36.6 C)     Temp Source 01/23/16 2203 Oral     SpO2 01/23/16 2203 99 %     Weight 01/23/16 2203 205 lb (92.987 kg)     Height 01/23/16 2203  (1.651 m)     Head Cir --      Peak Flow --      Pain Score 01/23/16 2204 8     Pain Loc --      Pain Edu? --      Excl. in GC? --     Constitutional: Alert and oriented. Well appearing and in no acute  distress. Eyes: Conjunctivae are normal. PERRL. EOMI. Head: Atraumatic. Nose: No congestion/rhinnorhea. Mouth/Throat: Mucous membranes are moist.  Oropharynx non-erythematous. Neck: No stridor. No cervical spine tenderness to palpation. Hematological/Lymphatic/Immunilogical: No cervical lymphadenopathy. Cardiovascular: Normal rate, regular rhythm. Grossly normal heart sounds.  Good peripheral circulation. Respiratory: Normal respiratory effort.  No retractions. Lungs CTAB. Gastrointestinal: Soft and nontender. No distention. No abdominal bruits. No CVA tenderness. Musculoskeletal: No lower extremity tenderness nor edema.  No joint effusions. Neurologic:  Normal speech and language. No gross focal neurologic deficits are appreciated. No gait instability. Skin:  Skin is warm, dry and intact. No rash noted. Smaller erythematous induration left lateral neck. Tick removal intact. Psychiatric: Mood and affect are normal. Speech and behavior are normal.  ____________________________________________   LABS (all labs ordered are listed, but only abnormal results are displayed)  Labs Reviewed - No data to display ____________________________________________  EKG   ____________________________________________  RADIOLOGY   ____________________________________________   PROCEDURES  Procedure(s) performed: None  Critical Care performed: No  ____________________________________________   INITIAL IMPRESSION / ASSESSMENT AND PLAN / ED COURSE  Pertinent labs & imaging results that were available during my care of the patient were reviewed by me and considered in my medical decision making (see chart for details).  Tick bite removal. He is given discharge Instructions. Patient advised follow-up family doctor if she develops any myalgia or fever. She get a prescription for doxycycline to take as directed. ____________________________________________   FINAL CLINICAL IMPRESSION(S) / ED  DIAGNOSES  Final diagnoses:  Tick bite with subsequent removal of tick      Joni Reining, PA-C 01/23/16 2337  Jennye Moccasin, MD 01/23/16 [phone]

## 2018-08-29 ENCOUNTER — Encounter (INDEPENDENT_AMBULATORY_CARE_PROVIDER_SITE_OTHER): Payer: Self-pay

## 2018-08-29 ENCOUNTER — Ambulatory Visit: Payer: No Typology Code available for payment source | Admitting: Pharmacy Technician

## 2018-08-29 DIAGNOSIS — Z79899 Other long term (current) drug therapy: Secondary | ICD-10-CM

## 2018-09-13 NOTE — Progress Notes (Signed)
  Completed [facility] application and contract.  Patient agreed to all terms of the [facility] contract.    Patient approved to receive medication assistance at [facility] through 2019 while in coverage gap.    Provided patient with community resource material based on her particular needs.     Sherilyn Dacosta Care Manager [facility]

## 2018-10-19 ENCOUNTER — Encounter (INDEPENDENT_AMBULATORY_CARE_PROVIDER_SITE_OTHER): Payer: Self-pay

## 2018-10-19 ENCOUNTER — Ambulatory Visit: Payer: No Typology Code available for payment source | Admitting: Pharmacist

## 2018-10-19 ENCOUNTER — Encounter: Payer: Self-pay | Admitting: Pharmacist

## 2018-10-19 VITALS — BP 140/78 | Ht 65.0 in | Wt 207.0 lb

## 2018-10-19 DIAGNOSIS — Z79899 Other long term (current) drug therapy: Secondary | ICD-10-CM

## 2018-10-19 NOTE — Progress Notes (Signed)
[facility] Management [facility] Visit Note  Patient: Jamie Barton MRN: [id number] Date of Birth: May 20, 1951 PCP: No primary care provider on file.   Alphonzo Lemmings 67 y.o. female presents for a medication therapy management visit today. Pt had to have husband at an appointment in [place] at 12pm so visit was only ~30 mins.  BP 140/78 (BP Location: Right Arm, Patient Position: Sitting, Cuff Size: Normal)   Ht 5\' 5"  (1.651 m)   Wt 207 lb (93.9 kg)   BMI 34.45 kg/m   Patient Information   Past Medical History:  Diagnosis Date  . CAD (coronary artery disease)   . Diabetes mellitus   . Hyperlipidemia   . Severe uncontrolled hypertension       Past Surgical History:  Procedure Laterality Date  . TUBAL LIGATION    . TUMOR EXCISION     Left ring finger     Family History  Problem Relation Age of Onset  . Pulmonary fibrosis Mother   . Colon cancer Sister   . Lung cancer Sister   . Aneurysm Brother        AAA    New Diagnoses (since last visit):   Family Support: Good  Lifestyle Diet: Breakfast: gravy biscuits or eggs, sausage/bacon, and toast or oatmeal Lunch: sandwich Dinner: hot dog or hamburger; eats out every night - states this is just easier for her schedule Drinks: diet sodas, flavored water            Social History   Substance and Sexual Activity  Alcohol Use No      Social History   Tobacco Use  Smoking Status Never Smoker      Health Maintenance  Topic Date Due  . HEMOGLOBIN A1C  03-04-1951  . Hepatitis C Screening  [birth year]  . FOOT EXAM  03/26/1963  . OPHTHALMOLOGY EXAM  03/26/1963  . HIV Screening  03/25/1968  . TETANUS/TDAP  03/25/1972  . PAP SMEAR  03/25/1974  . MAMMOGRAM  03/26/2003  . COLONOSCOPY  03/26/2003  . DEXA SCAN  03/25/2018  . PNA vac Low Risk Adult (1 of 2 - PCV13) 03/25/2018  . INFLUENZA VACCINE  07/13/2018   Outpatient Encounter Medications as of 10/19/2018  Medication Sig  . aspirin 81 MG  tablet Take 81 mg by mouth daily.    Marland Kitchen glipiZIDE (GLUCOTROL) 10 MG tablet Take 10 mg by mouth daily.    . Liraglutide (VICTOZA [place]) Inject 1.8 mLs into the skin.  Marland Kitchen lisinopril (PRINIVIL,ZESTRIL) 20 MG tablet Take 1 tablet (20 mg total) by mouth daily.  . metFORMIN (GLUCOPHAGE) 1000 MG tablet Take 1,000 mg by mouth 2 (two) times daily.    . metoprolol succinate (TOPROL-XL) 25 MG 24 hr tablet Take 1 tablet (25 mg total) by mouth daily.  . nitroGLYCERIN (NITROSTAT) 0.4 MG SL tablet Place 0.4 mg under the tongue every 5 (five) minutes as needed. May repeat for up to 3 doses.   . simvastatin (ZOCOR) 40 MG tablet Take 40 mg by mouth at bedtime.    . [DISCONTINUED] Biotin 5 MG TABS Take by mouth.    . [DISCONTINUED] clopidogrel (PLAVIX) 75 MG tablet Take 75 mg by mouth daily.    . [DISCONTINUED] doxycycline (MONODOX) 100 MG capsule Take 1 capsule (100 mg total) by mouth 2 (two) times daily.  . [DISCONTINUED] sitaGLIPtin (JANUVIA) 50 MG tablet Take 100 mg by mouth daily.    No facility-administered encounter medications on file as of 10/19/2018.     Assessment  and Plan: Pt had questions regarding her Part D enrollment. Discussed setting up an appointment with Lynnea Ferrier for Part D education.  Compliance/Adherence: Uses pill box for both herself and her husband; does not miss any doses.  Diabetes: Pt was previously on Januvia but was taken off and started on Victoza. Currently on metformin, glipizide and Victoza. Pt is needing a script for Victoza; is seeing provider on Monday to get this.  05/29/2015: A1c 7.8%, BG 206  HTN: Managed on lisinopril and metoprolol succ.  Made appointment for Part D education.  Angeline Slim, PharmD Candidate [employer] [facility]  Cosigned by: Denice Paradise, PharmD, RPh [facility]) [phone]

## 2018-10-24 ENCOUNTER — Other Ambulatory Visit: Payer: Self-pay

## 2020-04-10 ENCOUNTER — Ambulatory Visit: Payer: No Typology Code available for payment source | Attending: Internal Medicine

## 2020-04-10 DIAGNOSIS — Z23 Encounter for immunization: Secondary | ICD-10-CM

## 2020-04-10 NOTE — Progress Notes (Signed)
   Covid-19 Vaccination Clinic  Name:  Jamie Barton    MRN: [id number] DOB: May 29, 1951  04/10/2020  [name] was observed post Covid-19 immunization for 15 minutes without incident. She was provided with Vaccine Information Sheet and instruction to access the V-Safe system.   [name] was instructed to call 911 with any severe reactions post vaccine: Marland Kitchen Difficulty breathing  . Swelling of face and throat  . A fast heartbeat  . A bad rash all over body  . Dizziness and weakness   Immunizations Administered    Name Date Dose VIS Date Route   Pfizer COVID-19 Vaccine 04/10/2020 10:56 AM 0.3 mL 02/06/2019 Intramuscular   Manufacturer: [employer], [employer]   Lot: [id number]   NDC: [address]-9

## 2020-04-29 ENCOUNTER — Ambulatory Visit: Payer: No Typology Code available for payment source

## 2020-05-03 ENCOUNTER — Ambulatory Visit: Payer: No Typology Code available for payment source | Attending: Internal Medicine

## 2020-05-03 DIAGNOSIS — Z23 Encounter for immunization: Secondary | ICD-10-CM

## 2020-05-03 NOTE — Progress Notes (Signed)
   Covid-19 Vaccination Clinic  Name:  Jamie Barton    MRN: [id number] DOB: Apr 30, 1951  05/03/2020  [name] was observed post Covid-19 immunization for 30 minutes based on pre-vaccination screening without incident. She was provided with Vaccine Information Sheet and instruction to access the V-Safe system.   [name] was instructed to call 911 with any severe reactions post vaccine: Marland Kitchen Difficulty breathing  . Swelling of face and throat  . A fast heartbeat  . A bad rash all over body  . Dizziness and weakness   Immunizations Administered    Name Date Dose VIS Date Route   Pfizer COVID-19 Vaccine 05/03/2020  9:53 AM 0.3 mL 02/06/2019 Intramuscular   Manufacturer: [employer], [employer]   Lot: [id number]   NDC: [address]-3

## 2020-12-22 ENCOUNTER — Encounter: Payer: Self-pay | Admitting: Emergency Medicine

## 2020-12-22 ENCOUNTER — Ambulatory Visit
Admission: EM | Admit: 2020-12-22 | Discharge: 2020-12-22 | Disposition: A | Discharge status: Home / Self Care | Payer: Medicare HMO | Attending: Family Medicine | Admitting: Family Medicine

## 2020-12-22 ENCOUNTER — Other Ambulatory Visit: Payer: Self-pay

## 2020-12-22 DIAGNOSIS — B349 Viral infection, unspecified: Secondary | ICD-10-CM | POA: Diagnosis present

## 2020-12-22 DIAGNOSIS — Z20822 Contact with and (suspected) exposure to covid-19: Secondary | ICD-10-CM | POA: Diagnosis not present

## 2020-12-22 LAB — RAPID INFLUENZA A&B ANTIGENS
Influenza A (ARMC): NEGATIVE
Influenza B (ARMC): NEGATIVE

## 2020-12-22 MED ORDER — ONDANSETRON 8 MG PO TBDP: 8.0000 mg | ORAL_TABLET | Freq: Three times a day (TID) | ORAL | 0 refills | Status: AC | PRN

## 2020-12-22 NOTE — ED Triage Notes (Signed)
Patient c/o nausea, vomiting and fever that started last night.

## 2020-12-22 NOTE — ED Provider Notes (Signed)
[facility]    CSN: [id number] Arrival date & time: 12/22/20  1808      History   Chief Complaint Chief Complaint  Patient presents with  . Vomiting  . Fever    HPI Jamie Barton is a 70 y.o. female.   HPI   70 year old female here for evaluation of nausea vomiting and fever that started last night.  Reports that she has had a mild cough, severe body and joint aches, and a decreased appetite.  Patient has been vaccinated against COVID but has not received her booster shot.  Patient denies runny nose, sore throat, ear pain, shortness of breath or wheezing, diarrhea, or changes to her sense of taste or smell.  Patient does report generalized abdominal pain as well.  Past Medical History:  Diagnosis Date  . CAD (coronary artery disease)   . Diabetes mellitus   . Hyperlipidemia   . Severe uncontrolled hypertension     Patient Active Problem List   Diagnosis Date Noted  . DIAB W/O COMP TYPE II/UNS NOT STATED UNCNTRL 02/24/2010  . DYSLIPIDEMIA 02/24/2010  . HYPERTENSION, BENIGN 02/24/2010  . CAD 02/24/2010    Past Surgical History:  Procedure Laterality Date  . TUBAL LIGATION    . TUMOR EXCISION     Left ring finger    OB History   No obstetric history on file.      Home Medications    Prior to Admission medications   Medication Sig Start Date End Date Taking? Authorizing Provider  aspirin 81 MG tablet Take 81 mg by mouth daily.   Yes [provider]  glipiZIDE (GLUCOTROL) 10 MG tablet Take 10 mg by mouth daily.   Yes [provider]  Liraglutide (VICTOZA [place]) Inject 1.8 mLs into the skin.   Yes [provider]  lisinopril (PRINIVIL,ZESTRIL) 20 MG tablet Take 1 tablet (20 mg total) by mouth daily. 03/22/11  Yes Antonieta Iba, MD  metFORMIN (GLUCOPHAGE) 1000 MG tablet Take 1,000 mg by mouth 2 (two) times daily.   Yes [provider]  metoprolol succinate (TOPROL-XL) 25 MG 24 hr tablet Take 1 tablet (25  mg total) by mouth daily. 08/03/11  Yes Gollan, Tollie Pizza, MD  nitroGLYCERIN (NITROSTAT) 0.4 MG SL tablet Place 0.4 mg under the tongue every 5 (five) minutes as needed. May repeat for up to 3 doses.   Yes [provider]  ondansetron (ZOFRAN ODT) 8 MG disintegrating tablet Take 1 tablet (8 mg total) by mouth every 8 (eight) hours as needed for nausea or vomiting. 12/22/20  Yes Becky Augusta, NP  simvastatin (ZOCOR) 40 MG tablet Take 40 mg by mouth at bedtime.   Yes [provider]    Family History Family History  Problem Relation Age of Onset  . Pulmonary fibrosis Mother   . Colon cancer Sister   . Lung cancer Sister   . Aneurysm Brother        AAA    Social History Social History   Tobacco Use  . Smoking status: Never Smoker  . Smokeless tobacco: Never Used  Vaping Use  . Vaping Use: Never used  Substance Use Topics  . Alcohol use: No  . Drug use: No     Allergies   Codeine   Review of Systems Review of Systems  Constitutional: Positive for appetite change, diaphoresis and fever. Negative for activity change.  HENT: Negative for congestion, ear pain, rhinorrhea and sore throat.   Respiratory: Positive for cough.  Negative for shortness of breath.   Gastrointestinal: Positive for abdominal pain, nausea and vomiting. Negative for diarrhea.  Genitourinary: Negative for dysuria, frequency, hematuria and urgency.  Musculoskeletal: Positive for arthralgias and myalgias.  Skin: Negative for rash.  Neurological: Negative for headaches.  Hematological: Negative.   Psychiatric/Behavioral: Negative.      Physical Exam Triage Vital Signs ED Triage Vitals  Enc Vitals Group     BP 12/22/20 1934 (!) 144/73     Pulse Rate 12/22/20 1934 (!) 114     Resp 12/22/20 1934 18     Temp 12/22/20 1934 99.1 F (37.3 C)     Temp Source 12/22/20 1934 Oral     SpO2 12/22/20 1934 98 %     Weight --      Height 12/22/20 1937 5\' 5"  (1.651 m)     Head Circumference --       Peak Flow --      Pain Score 12/22/20 1936 0     Pain Loc --      Pain Edu? --      Excl. in GC? --    No data found.  Updated Vital Signs BP (!) 144/73 (BP Location: Right Arm)   Pulse (!) 114   Temp 99.1 F (37.3 C) (Oral)   Resp 18   Ht 5\' 5"  (1.651 m)   SpO2 98%   BMI 34.45 kg/m   Visual Acuity Right Eye Distance:   Left Eye Distance:   Bilateral Distance:    Right Eye Near:   Left Eye Near:    Bilateral Near:     Physical Exam Vitals and nursing note reviewed.  Constitutional:      General: She is not in acute distress.    Appearance: Normal appearance. She is not toxic-appearing.  HENT:     Head: Normocephalic and atraumatic.  Cardiovascular:     Rate and Rhythm: Normal rate and regular rhythm.     Pulses: Normal pulses.     Heart sounds: Normal heart sounds. No murmur heard. No gallop.   Pulmonary:     Effort: Pulmonary effort is normal.     Breath sounds: Normal breath sounds. No wheezing or rales.  Abdominal:     General: Abdomen is flat. Bowel sounds are normal. There is no distension.     Palpations: Abdomen is soft.     Tenderness: There is abdominal tenderness. There is no guarding or rebound.  Skin:    General: Skin is warm and dry.     Capillary Refill: Capillary refill takes less than 2 seconds.     Findings: No erythema or rash.  Neurological:     General: No focal deficit present.     Mental Status: She is alert and oriented to person, place, and time.  Psychiatric:        Mood and Affect: Mood normal.        Behavior: Behavior normal.        Thought Content: Thought content normal.        Judgment: Judgment normal.      UC Treatments / Results  Labs (all labs ordered are listed, but only abnormal results are displayed) Labs Reviewed  RAPID INFLUENZA A&B ANTIGENS  SARS CORONAVIRUS 2 (TAT 6-24 HRS)    EKG   Radiology No results found.  Procedures Procedures (including critical care time)  Medications Ordered in  UC Medications - No data to display  Initial Impression / Assessment and Plan / UC  Course  I have reviewed the triage vital signs and the nursing notes.  Pertinent labs & imaging results that were available during my care of the patient were reviewed by me and considered in my medical decision making (see chart for details).   Patient is here for evaluation of generalized abdominal pain, nausea, vomiting, and fever that started last p.m.  Patient symptoms are suspicious for COVID versus the flu.  Patient's physical exam reveals a soft, nondistended abdomen with positive bowel sounds in all 4 quadrants.  Patient has some left upper quadrant and left lower quadrant tenderness to her abdomen without guarding or rebound.  Will check patient for COVID and flu.  Flu is negative.  COVID test pending.  Will discharge patient home with a diagnosis of viral illness, give Zofran for nausea, have patient use Tylenol and ibuprofen for body aches, and give return precautions.   Final Clinical Impressions(s) / UC Diagnoses   Final diagnoses:  Viral illness     Discharge Instructions     Isolate at home until the results of your COVID test are back.  If you test positive then you will need to quarantine for 5 days from your symptoms started.  After 5 days you can break quarantine if your symptoms have improved and you have not had a fever for 24 hours.  Use the Zofran every 8 hours as needed for nausea.  Use over-the-counter Tylenol and ibuprofen as needed for body aches and fever.  If you develop any worsening pain, diarrhea stools, or blood in your stools return for reevaluation or go to the ER.    ED Prescriptions    Medication Sig Dispense Auth. Provider   ondansetron (ZOFRAN ODT) 8 MG disintegrating tablet Take 1 tablet (8 mg total) by mouth every 8 (eight) hours as needed for nausea or vomiting. 20 tablet Becky Augusta, NP     PDMP not reviewed this encounter.   Becky Augusta,  NP 12/22/20 2036

## 2020-12-22 NOTE — Discharge Instructions (Addendum)
Isolate at home until the results of your COVID test are back.  If you test positive then you will need to quarantine for 5 days from your symptoms started.  After 5 days you can break quarantine if your symptoms have improved and you have not had a fever for 24 hours.  Use the Zofran every 8 hours as needed for nausea.  Use over-the-counter Tylenol and ibuprofen as needed for body aches and fever.  If you develop any worsening pain, diarrhea stools, or blood in your stools return for reevaluation or go to the ER.

## 2020-12-23 LAB — SARS CORONAVIRUS 2 (TAT 6-24 HRS): SARS Coronavirus 2: NEGATIVE

## 2021-07-01 ENCOUNTER — Other Ambulatory Visit: Payer: Self-pay

## 2024-01-23 ENCOUNTER — Telehealth: Payer: Self-pay | Admitting: Urology

## 2024-01-23 NOTE — Telephone Encounter (Signed)
 Jamie Barton called and wants to schedule a new patient appointment for a second opinion. She is scheduled to have ROBOTIC XI LAPAROSCOPY,SURGICAL; PYELOPLASTY at [facility] on 02/07/24. She said she is not really sure if she even needs the surgery, and Dr. Cherylene Corrente was recommeded by family members. If surgery is needed, is this something we can do? Records are in patient chart. Please advise.

## 2024-01-24 NOTE — Telephone Encounter (Signed)
Called Jamie Barton and relayed information to her. She verbalized understanding, and did not want to pursue scheduling and appointment with our office at this time.

## 2024-01-24 NOTE — Telephone Encounter (Signed)
This is not a surgery Dr. Lonna Cobb performs. Dr. Apolinar Junes and Dr. Richardo Hanks are the only ones who do robots. I'm not sure how soon she could be seen, we book robot cases out a ways.
